# Patient Record
Sex: Female | Born: 1965 | Race: White | Hispanic: No | Marital: Married | Smoking: Never smoker
Health system: Southern US, Community
[De-identification: ages and names within clinical notes are randomized; demographics above are authoritative.]

## PROBLEM LIST (undated history)

## (undated) DIAGNOSIS — R569 Unspecified convulsions: Secondary | ICD-10-CM

## (undated) DIAGNOSIS — T7840XA Allergy, unspecified, initial encounter: Secondary | ICD-10-CM

## (undated) DIAGNOSIS — N189 Chronic kidney disease, unspecified: Secondary | ICD-10-CM

## (undated) DIAGNOSIS — Z5189 Encounter for other specified aftercare: Secondary | ICD-10-CM

## (undated) DIAGNOSIS — K219 Gastro-esophageal reflux disease without esophagitis: Secondary | ICD-10-CM

## (undated) DIAGNOSIS — D649 Anemia, unspecified: Secondary | ICD-10-CM

## (undated) DIAGNOSIS — M1990 Unspecified osteoarthritis, unspecified site: Secondary | ICD-10-CM

## (undated) HISTORY — DX: Unspecified convulsions: R56.9

## (undated) HISTORY — DX: Encounter for other specified aftercare: Z51.89

## (undated) HISTORY — DX: Chronic kidney disease, unspecified: N18.9

## (undated) HISTORY — PX: ABLATION: SHX5711

## (undated) HISTORY — PX: ABDOMINAL HYSTERECTOMY: SHX81

## (undated) HISTORY — DX: Anemia, unspecified: D64.9

## (undated) HISTORY — DX: Allergy, unspecified, initial encounter: T78.40XA

## (undated) HISTORY — DX: Unspecified osteoarthritis, unspecified site: M19.90

## (undated) HISTORY — DX: Gastro-esophageal reflux disease without esophagitis: K21.9

---

## 1999-06-01 ENCOUNTER — Other Ambulatory Visit: Admission: RE | Admit: 1999-06-01 | Discharge: 1999-06-01 | Payer: Self-pay | Admitting: Obstetrics and Gynecology

## 2001-12-04 ENCOUNTER — Other Ambulatory Visit: Admission: RE | Admit: 2001-12-04 | Discharge: 2001-12-04 | Payer: Self-pay | Admitting: Obstetrics and Gynecology

## 2002-11-27 ENCOUNTER — Other Ambulatory Visit: Admission: RE | Admit: 2002-11-27 | Discharge: 2002-11-27 | Payer: Self-pay | Admitting: Obstetrics and Gynecology

## 2004-03-08 ENCOUNTER — Other Ambulatory Visit: Admission: RE | Admit: 2004-03-08 | Discharge: 2004-03-08 | Payer: Self-pay | Admitting: Obstetrics and Gynecology

## 2004-06-09 ENCOUNTER — Ambulatory Visit (HOSPITAL_COMMUNITY): Admission: RE | Admit: 2004-06-09 | Discharge: 2004-06-09 | Payer: Self-pay | Admitting: Obstetrics and Gynecology

## 2004-06-09 ENCOUNTER — Encounter (INDEPENDENT_AMBULATORY_CARE_PROVIDER_SITE_OTHER): Payer: Self-pay | Admitting: Specialist

## 2005-03-21 ENCOUNTER — Other Ambulatory Visit: Admission: RE | Admit: 2005-03-21 | Discharge: 2005-03-21 | Payer: Self-pay | Admitting: Obstetrics and Gynecology

## 2007-07-19 ENCOUNTER — Ambulatory Visit (HOSPITAL_COMMUNITY): Admission: RE | Admit: 2007-07-19 | Discharge: 2007-07-19 | Payer: Self-pay | Admitting: Obstetrics and Gynecology

## 2007-07-19 ENCOUNTER — Encounter (INDEPENDENT_AMBULATORY_CARE_PROVIDER_SITE_OTHER): Payer: Self-pay | Admitting: Obstetrics and Gynecology

## 2011-02-24 ENCOUNTER — Emergency Department (HOSPITAL_COMMUNITY): Admission: EM | Admit: 2011-02-24 | Payer: Self-pay | Source: Home / Self Care

## 2011-05-03 NOTE — H&P (Signed)
Karen [name], Karen [name]                  ACCOUNT NO.:  [id number]   MEDICAL RECORD NO.:  [id number]          PATIENT TYPE:  AMB   LOCATION:  SDC                           FACILITY:  [facility]   PHYSICIAN:  Guy Sandifer. Henderson Cloud, M.D. DATE OF BIRTH:  [birth year]   DATE OF ADMISSION:  07/19/2007  DATE OF DISCHARGE:                              HISTORY & PHYSICAL   CHIEF COMPLAINT:  Heavy menses.   HISTORY OF PRESENT ILLNESS:  This patient is a 45 year old married white  female, G0, P0, whose husband is status post vasectomy, with  increasingly heavy menses.  She will have heavy flows, overflowing pads  and tampons, with clotting for several days at a time.  For careful  discussion of the options, she is being admitted for hysteroscopy D&C  with NovaSure endometrial ablation.   PAST MEDICAL HISTORY:  Seizure in office at age 43.   FAMILY HISTORY:  Diabetes, maternal grandmother; skin cancer mother and  maternal grandmother.   SOCIAL HISTORY:  Denies tobacco, alcohol or drug abuse.   MEDICATIONS:  Trazodone p.r.n. sleep, penicillin leading to seizure.   PAST SURGICAL HISTORY:  Laparoscopy, hysterectomy, D&C with an ovarian  cystectomy for a dermoid in 2005.   REVIEW OF SYSTEMS:  NEURO:  Denies headache.  CARDIOVASCULAR:  Denies  chest pain.  PULMONARY:  Denies shortness of breath.  GI:  Denies recent  changes and bowel habits.   PHYSICAL EXAMINATION:  VITAL SIGNS:  Height 5 feet, 1 inch, weight 146  pounds, blood pressure 110/72.  HEENT:  Without thyromegaly.  LUNGS:  Clear to auscultation.  HEART:  Regular rate and rhythm.  BACK:  Without CVA tenderness.  BREASTS:  Without mass or tracks or discharge.  ABDOMEN:  Soft, nontender without masses.  PELVIC:  Vulva, vagina and cervix without lesions.  Uterus is  anteverted, normal size, mobile, nontender.  Adnexa nontender without  masses.  EXTREMITIES:  Grossly within normal limits.  NEUROLOGICAL:  Grossly within normal limits.   ASSESSMENT:   Menorrhagia.   PLAN:  Hysteroscopy D&C with NovaSure endometrial ablation.      Guy Sandifer Henderson Cloud, M.D.  Electronically Signed     JET/MEDQ  D:  07/16/2007  T:  07/16/2007  Job:  [id number]

## 2011-05-03 NOTE — Op Note (Signed)
Karen [name], Karen [name]                  ACCOUNT NO.:  [id number]   MEDICAL RECORD NO.:  [id number]          PATIENT TYPE:  AMB   LOCATION:  SDC                           [facility]ACILITY:  [facility]   PHYSICIAN:  Guy Sandifer. Henderson Cloud, M.D. DATE OF BIRTH:  [birth year]   DATE OF PROCEDURE:  07/19/2007  DATE OF DISCHARGE:                               OPERATIVE REPORT   PREOPERATIVE DIAGNOSIS:  Menorrhagia.   POSTOPERATIVE DIAGNOSIS:  Menorrhagia.   PROCEDURE:  NovaSure endometrial ablation and hysteroscopy with  dilatation and curettage and 1% Xylocaine paracervical block   SURGEON:  Guy Sandifer. Henderson Cloud, M.D.   ANESTHESIA:  General with LMA.   SPECIMENS:  Endometrial curettings to pathology.   ESTIMATED BLOOD LOSS:  Minimal.   INPUT AND OUTPUT DISTENDING MEDIA:  50 mL deficit.   INDICATIONS AND CONSENT:  This patient is a 45 year old married white  female, G0, P0, whose husband is status post vasectomy, has had  increasingly heavy menses.  Details are dictated in the history and  physical.  Hysteroscopy, D&C, and NovaSure endometrial ablation has been  discussed preoperatively.  The potential risks and complications have  been reviewed preoperatively including but limited to infection, organ  damage, bleeding requiring transfusion of blood products with possible  transfusion reaction, HIV and hepatitis acquisition, DVT, PE, pneumonia,  hysterectomy, recurrent heavy or abnormal bleeding. and pelvic pain.  All questions were answered and consent signed on the chart.   FINDINGS:  The endometrial cavity is without abnormal structure.  The  fallopian tube ostia are identified bilaterally.   PROCEDURE IN DETAIL:  The patient is taken to the operating room where  she is placed in the dorsal supine position and general anesthesia is  induced via LMA.  She is then placed in the dorsal lithotomy position  where she is prepped, bladder straight catheterized, and she is draped  in a sterile fashion.  A bivalve  speculum was placed in the vagina and  the anterior cervical lip was injected with 1% plain Xylocaine and  grasped with a single tooth tenaculum.  Paracervical block of  approximately 20 mL total of 1% plain Xylocaine is placed at the 2, 4,  5, 7, 8, and 10 o'clock positions.  Uterine length is 8 cm and the  cervical length is 4 cm.  The cervix is gently progressively dilated to  a 31 dilator.  Diagnostic hysteroscope was placed in the endocervical  canal and advanced under direct visualization using the distending  media.  The above findings were noted.  The hysteroscope was then  withdrawn.  Sharp curettage is carried out.  The NovaSure endometrial  ablation device is then placed.  Cavity test is passed on the first  attempt.  Ablation is then carried out for approximately 1 minute 37  seconds.  The NovaSure device is then removed intact.  Inspection again with advancement of the hysteroscope under direct  visualization reveals a good ablation through the entire cavity and no  evidence of perforation.  The hysteroscope is removed.  All counts were  correct.  The patient is awakened  and taken to the recovery room in  stable condition.      Guy Sandifer Henderson Cloud, M.D.  Electronically Signed     JET/MEDQ  D:  07/19/2007  T:  07/19/2007  Job:  [id number]

## 2011-05-06 NOTE — Op Note (Signed)
NAME:  Karen [name], Karen [name]                           ACCOUNT NO.:  [id number]   MEDICAL RECORD NO.:  [id number]                   PATIENT TYPE:  AMB   LOCATION:  SDC                                  [facility]ILITY:  [facility]   PHYSICIAN:  Guy Sandifer. Arleta Creek, M.D.           DATE OF BIRTH:  1966-09-04   DATE OF PROCEDURE:  06/09/2004  DATE OF DISCHARGE:                                 OPERATIVE REPORT   PREOPERATIVE DIAGNOSES:  1. Menorrhagia.  2. Ovarian cysts.   POSTOPERATIVE DIAGNOSES:  1. Menorrhagia.  2. Ovarian cysts.   PROCEDURES:  1. Laparoscopy with left ovarian cystectomy.  2. Right paratubal cystectomy.  3. Hysteroscopy dilatation and curettage.  4. 1% Xylocaine paracervical block.   SURGEON:  Guy Sandifer. Henderson Cloud, M.D.   ANESTHESIA:  General with endotracheal intubation.   ESTIMATED BLOOD LOSS:  Less than 50 mL.   SPECIMENS:  1. Endometrial curettings.  2. Right paraovarian cyst.  3. Left ovarian cyst.   INS AND OUTS:  Sorbitol distending median 10 mL deficit.   INDICATIONS FOR PROCEDURE:  This patient is a 45 year old single white  female, G0, P0 with increasingly heavy menstrual flows as well as ovarian  cyst.  Details are dictated in the history and physical.  Hysteroscopy, D&C,  laparoscopy, possible ovarian cystectomies, possible unilateral salpingo-  oophorectomy has been discussed preoperatively.  Potential risks and  complications have been reviewed preoperatively including, not limited to,  infection, bowel, bladder, ureteral damage, bleeding requiring transfusion  of blood products and possible transfusion reaction, HIV and hepatitis  acquisition, DVT, PE, pneumonia, hysterectomy, recurrent cysts, and/or heavy  bleeding and laparotomy.  All questions have been answered and consent is  signed on the chart.   FINDINGS:  Upper abdomen is grossly normal.  There is a 3 cm translucent  right paratubal cyst immediately at the distal pole of the right ovary.  The  remainder of the right ovary looks normal.  The left ovary has a 1 to 2 cm  cysts at its distal pole.  Anterior and posterior cul-de-sacs are normal and  the uterus is normal.  The endometrial cavity is normal.  Both fallopian  tube ostia are identified.   DESCRIPTION OF PROCEDURE:  The patient is taken to the operating room where  she is identified, placed in the dorsal supine position and general  anesthesia is induced via endotracheal intubation.  She is then placed in  the dorsal lithotomy position, prepped abdominally and vaginally.  Bladder  is straight catheterized and she is draped in sterile fashion.  Bivalve  speculum is placed in the vagina and anterior cervical lip is injected with  1% Xylocaine and grasped with single-tooth tenaculum.  Paracervical block is  placed at the 2, 4, 5, 7, 8 and 10 o'clock positions with approximately 20  mL total of 1% Xylocaine plain.  Cervix is gently progressively dilated with  a [address]  dilator and the diagnostic hysteroscope is advanced through the  endocervical canal under direct visualization using Sorbitol distending  median.  The above findings were noted.  The hysteroscope was withdrawn.  Sharp curettage is carried out.  Single-tooth tenaculum is replaced with the  Hulka tenaculum.  Attention is turned to the abdomen.  A small  infraumbilical incision is made.  Towel clips are placed on the other side  of the umbilicus to aid in the elevation of the anterior abdominal wall.  Veress needle  is placed.  Syringe and drop test are normal and two liters  of gas are insufflated under low pressure.  There is good tympany in the  right upper quadrant.  Veress needle  is removed and is replaced with a  10/11 disposable trocar sleeve.  The 5 mm suprapubic incision is made and a  5 mm disposable bladeless trocar sleeve is placed under direct visualization  without difficulty.  The above findings are noted.  The peritoneum overlying  the cyst is  then incised with scissors and hydrodissected.  It is then taken  down sharply and the cyst is enucleated intact.  Hemostasis is obtained with  bipolar cautery.  Then using the 5 mm laparoscope to the suprapubic trocar  sleeve.  The EndoCatch is used to retrieve the cyst from the pelvis through  the umbilical incision.  Attention is then returned to the operative  laparoscope.  The left ovary is incised with scissors over the cyst.  This  is enucleated sharply and retrieved through the umbilical trocar sleeve.  Hemostasis is obtained with bipolar cautery.  Excess fluid is removed.  Intercede is back loaded through laparoscope and wrapped around the right  fallopian tube and the left ovary and slightly moistened.  Excess fluid is  removed.  Pneumoperitoneum is reduced and both trocar sleeves are removed.  A 0 Vicryl suture is used to close the deeper subcutaneous layers of the  umbilical incision with care being taken not to pick up any underlying  structures.  The skin is closed in umbilical incision with 2-0 Vicryl suture  in a mattress type fashion.  Both incisions are injected with 0.5% plain  Marcaine.  Dermabond is applied to both skin incisions.  Hulka is removed.  Good hemostasis is noted.  All counts are correct.  The patient is awakened  and taken to the recovery room in stable condition.                                               Guy Sandifer Arleta Creek, M.D.    JET/MEDQ  D:  06/09/2004  T:  06/09/2004  Job:  [id number]

## 2011-05-06 NOTE — H&P (Signed)
NAME:  Karen [name], Karen [name]                           ACCOUNT NO.:  [id number]   MEDICAL RECORD NO.:  [id number]                   PATIENT TYPE:  AMB   LOCATION:  SDC                                  [facility]ILITY:  [facility]   PHYSICIAN:  Guy Sandifer. Arleta Creek, M.D.           DATE OF BIRTH:  1966-04-11   DATE OF ADMISSION:  06/09/2004  DATE OF DISCHARGE:                                HISTORY & PHYSICAL   CHIEF COMPLAINT:  Heavy bleeding and ovarian cysts.   HISTORY OF PRESENT ILLNESS:  This patient is a 45 year old single white  female, G0, P0, who has increasingly heavy menstrual flows.  Ultrasound in  my office on April 05, 2004 revealed the uterus measuring 8.3 x 3.4 x 4.7  cm.  Sonohysterogram revealed some irregular thickening of the anterior wall  of the endometrial canal.  Bilateral ovarian cysts were also noted.  Repeat  ultrasound on June 01, 2004 revealed the left ovary to contain a 1.56-cm  irregularly shaped cyst.  The right ovary contained multiple cysts, one  measuring 1.6 cm with 2 solid components suspicious for dermoid.  After  discussing options of management, the patient is being admitted for  hysteroscopy with D&C, laparoscopy with possible LSO, possible RSO, possible  ovarian cystectomies.   PAST MEDICAL HISTORY:  1. History of reflux.  2. History of a single seizure in a doctor's office at age 74 with a     negative workup.   PAST SURGICAL HISTORY:  Past surgical history is negative.   MEDICATIONS:  None.   ALLERGIES:  PENICILLIN with a question of seizure activity following that.   SOCIAL HISTORY:  The patient denies tobacco, alcohol or drug abuse.   FAMILY HISTORY:  Diabetes in maternal grandmother, skin cancer in maternal  grandmother, basal cell carcinoma in mother.   REVIEW OF SYSTEMS:  NEUROLOGIC:  Denies headache.  CARDIAC:  Denies chest  pain.  PULMONARY:  Denies shortness of breath.  GI:  Denies recent changes  in bowel habits.   PHYSICAL EXAM:  GENERAL:   Height 5 feet 1 inch.  Weight 195 pounds.  VITAL SIGNS:  Blood pressure 136/76.  HEENT/NECK:  Without thyromegaly.  LUNGS:  Lungs clear to auscultation.  HEART:  Regular rate and rhythm.  BACK:  Back without CVA tenderness.  BREASTS:  Breasts without mass, retraction or discharge.  ABDOMEN:  Abdomen soft and nontender without masses.  PELVIC EXAM:  Vulva, vagina and cervix without lesion.  Uterus normal size,  mobile and nontender.  Adnexa nontender without palpable masses.  EXTREMITIES/NEUROLOGICAL:  Extremities and neurological exam grossly within  normal limits.   ASSESSMENT:  1. Menorrhagia.  2. Bilateral ovarian cysts.   PLAN:  Laparoscopy with possible right salpingo-oophorectomy, possible left  salpingo-oophorectomy, possible ovarian cystectomies and hysteroscopy with  dilatation and curettage.  Guy Sandifer Arleta Creek, M.D.    JET/MEDQ  D:  06/08/2004  T:  06/09/2004  Job:  [id number]

## 2011-10-03 LAB — CBC
Hemoglobin: 13.3
MCHC: 34.6
RBC: 4.22

## 2011-10-28 ENCOUNTER — Other Ambulatory Visit: Payer: Self-pay | Admitting: *Deleted

## 2011-10-28 NOTE — Telephone Encounter (Signed)
RN returned patient call.  Pt called several times very upset that her OxyContin authorization had not been completed.  Advised pt that we haven't rec'd an authorization from the pharmacy.  RN gave pt fax number of [phone] to have the pharmacy fax the request.  Pt verbalized understanding and stated that she was going to the pharmacy at 1pm and she would have them fax it at this time.  Pt stated that the request was due to increasing the medication from bid to tid.

## 2014-05-09 LAB — HM COLONOSCOPY

## 2014-10-14 ENCOUNTER — Other Ambulatory Visit: Payer: Self-pay | Admitting: Obstetrics and Gynecology

## 2014-10-16 LAB — CYTOLOGY - PAP

## 2015-08-17 ENCOUNTER — Other Ambulatory Visit: Payer: Self-pay | Admitting: Obstetrics and Gynecology

## 2017-11-02 ENCOUNTER — Other Ambulatory Visit: Payer: Self-pay | Admitting: Obstetrics and Gynecology

## 2017-11-02 DIAGNOSIS — R52 Pain, unspecified: Secondary | ICD-10-CM

## 2017-11-06 ENCOUNTER — Ambulatory Visit
Admission: RE | Admit: 2017-11-06 | Discharge: 2017-11-06 | Disposition: A | Discharge status: Home / Self Care | Payer: BLUE CROSS/BLUE SHIELD | Source: Ambulatory Visit | Attending: Obstetrics and Gynecology | Admitting: Obstetrics and Gynecology

## 2017-11-06 DIAGNOSIS — R52 Pain, unspecified: Secondary | ICD-10-CM

## 2019-03-19 IMAGING — MG 2D DIGITAL DIAGNOSTIC BILATERAL MAMMOGRAM WITH CAD AND ADJUNCT T
8 of 15 series · 8 of 35 positions shown · non-contrast
Comparison: Previous exam(s).

CLINICAL DATA: Patient presents for focal tenderness within the
upper-outer left breast for approximately 2 months.

EXAM:
2D DIGITAL DIAGNOSTIC BILATERAL MAMMOGRAM WITH CAD AND ADJUNCT TOMO
ULTRASOUND LEFT BREAST

[L TAN]
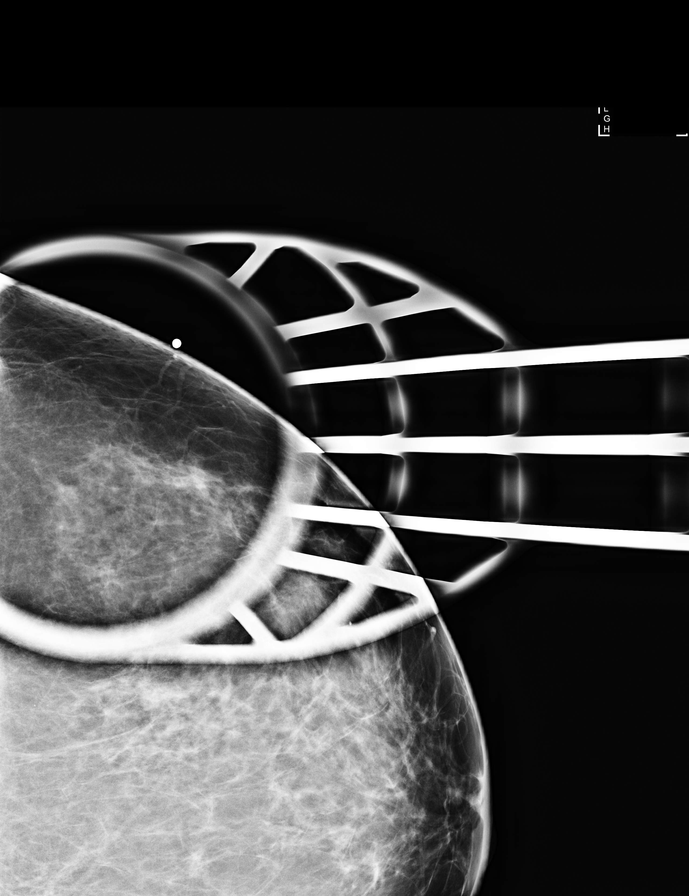

[R MLO synth-2D]
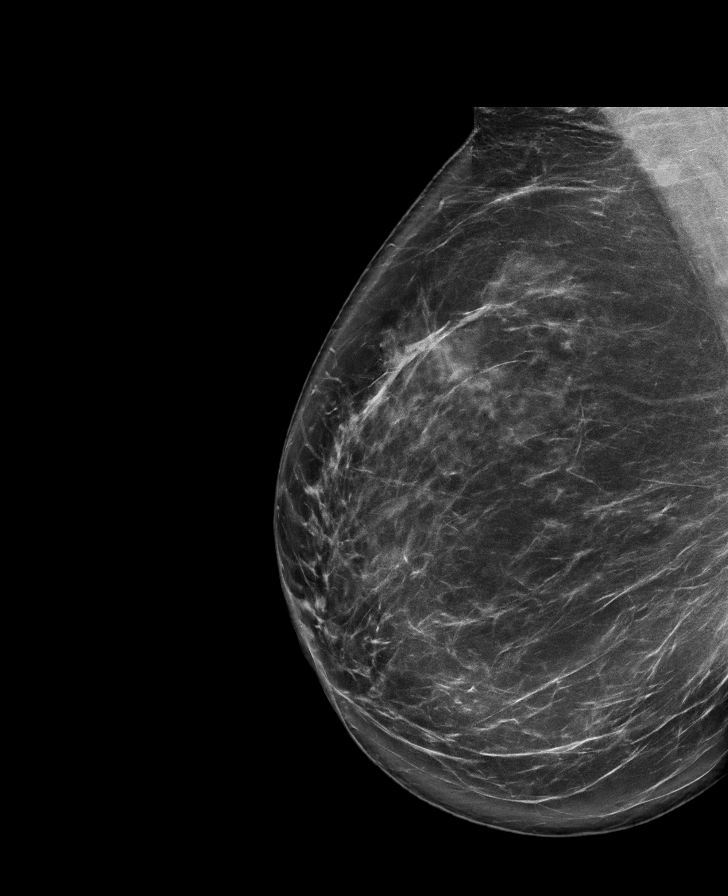

[L MLO]
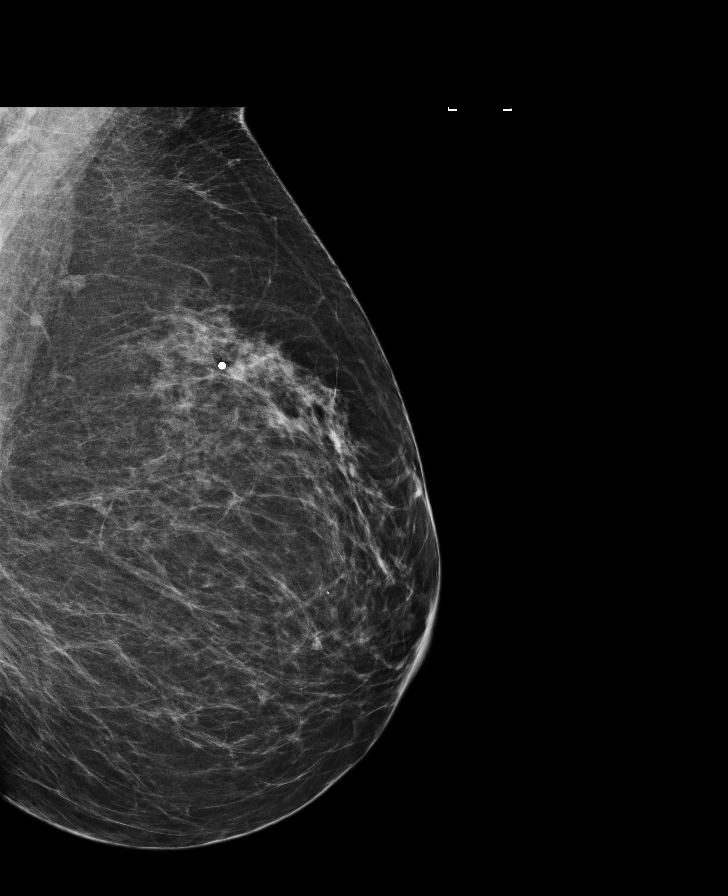

[R CC]
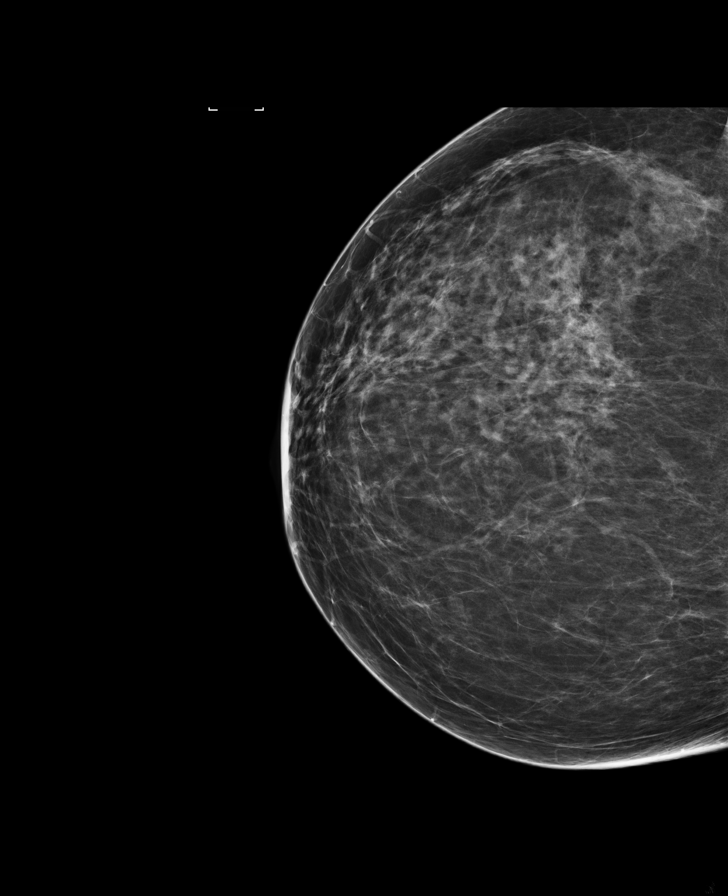

[R MLO]
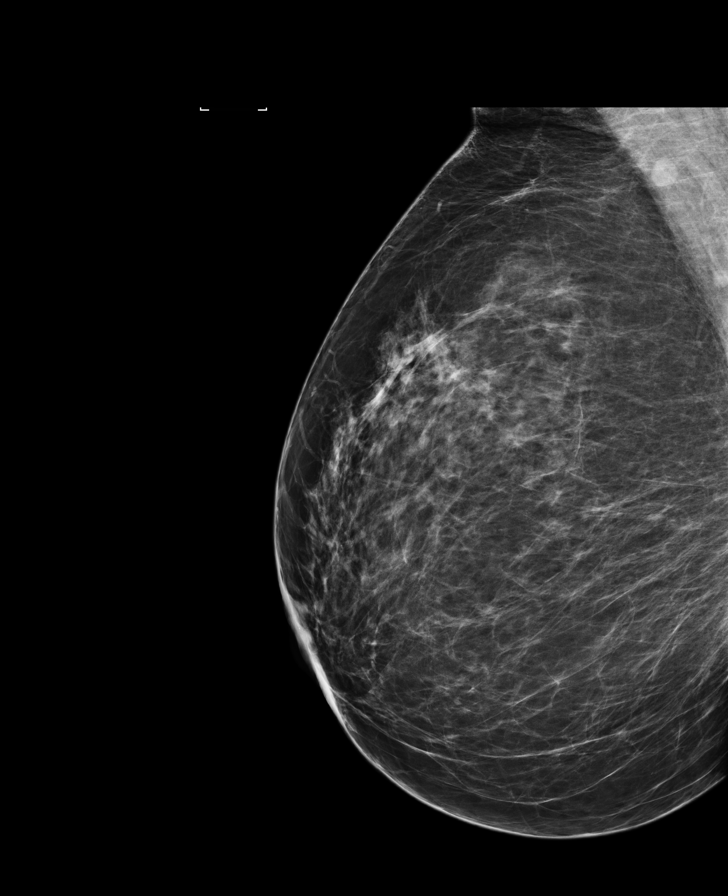

[L TAN synth-2D]
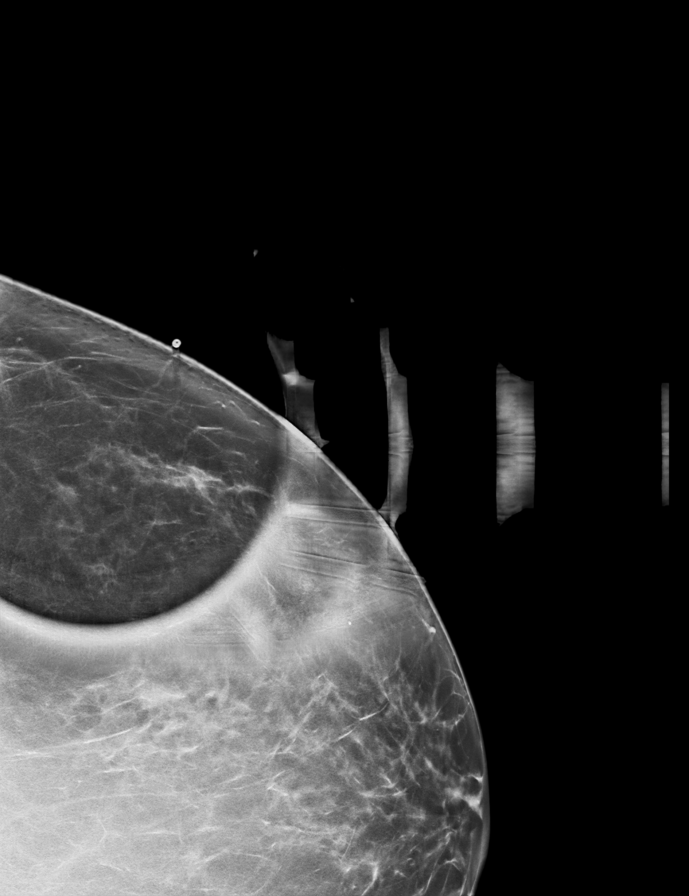

[L CC synth-2D]
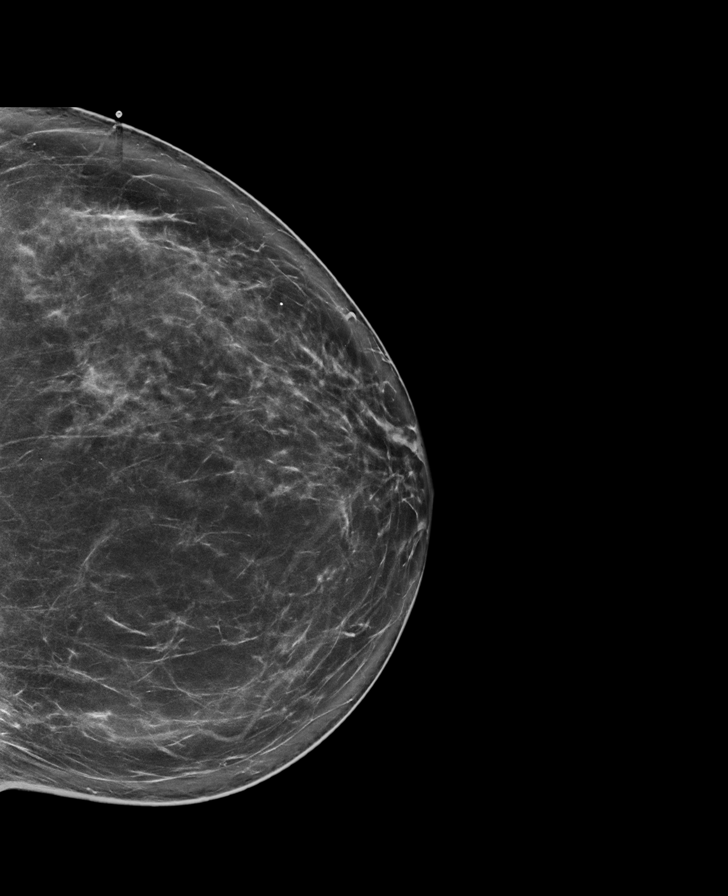

[L CC]
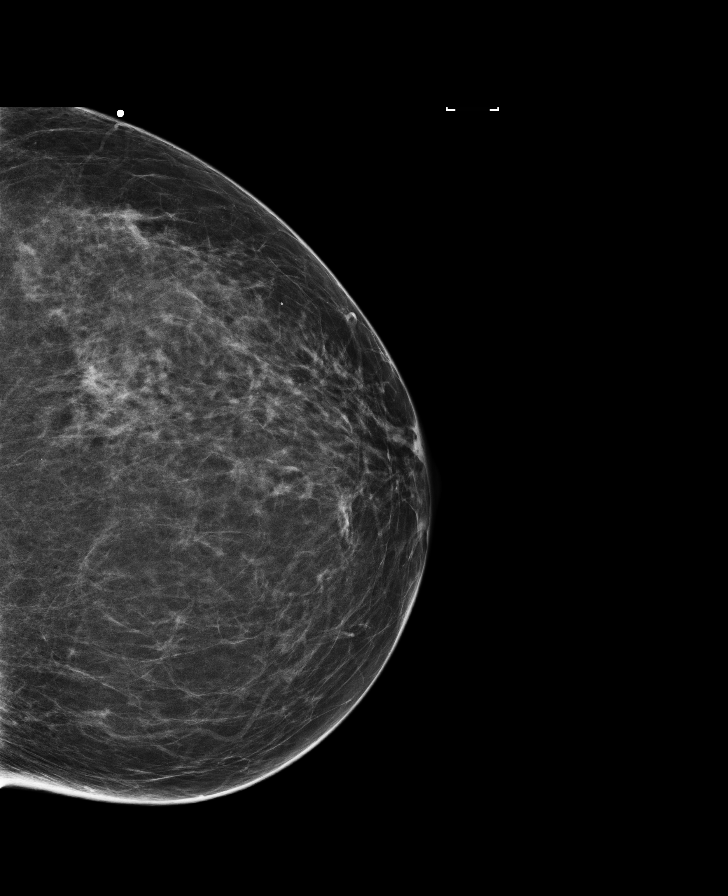

[8 of 35 positions shown; findings below may reference images not displayed]

ACR Breast Density Category c: The breast tissue is heterogeneously
dense, which may obscure small masses.
FINDINGS: No concerning masses, calcifications or distortion identified within
either breast.

Mammographic images were processed with CAD.

On physical exam, I palpate no discrete mass upper outer left
breast.

Targeted ultrasound is performed, showing normal tissue left breast
2 o'clock position 10 cm from the nipple at the patient reported
site of focal tenderness.
IMPRESSION: No evidence for malignancy.

Continued clinical evaluation for reported focal tenderness left
breast.

RECOMMENDATION:
Screening mammogram in one year.(Code:GB-[id number])

I have discussed the findings and recommendations with the patient.
Results were also provided in writing at the conclusion of the
visit. If applicable, a reminder letter will be sent to the patient
regarding the next appointment.

BI-RADS CATEGORY  1: Negative.

## 2023-01-09 DIAGNOSIS — D649 Anemia, unspecified: Secondary | ICD-10-CM | POA: Diagnosis not present

## 2023-01-09 DIAGNOSIS — Z1231 Encounter for screening mammogram for malignant neoplasm of breast: Secondary | ICD-10-CM | POA: Diagnosis not present

## 2023-01-09 DIAGNOSIS — Z130 Encounter for screening for diseases of the blood and blood-forming organs and certain disorders involving the immune mechanism: Secondary | ICD-10-CM | POA: Diagnosis not present

## 2023-01-09 DIAGNOSIS — Z131 Encounter for screening for diabetes mellitus: Secondary | ICD-10-CM | POA: Diagnosis not present

## 2023-01-09 DIAGNOSIS — Z1321 Encounter for screening for nutritional disorder: Secondary | ICD-10-CM | POA: Diagnosis not present

## 2023-01-09 DIAGNOSIS — Z01419 Encounter for gynecological examination (general) (routine) without abnormal findings: Secondary | ICD-10-CM | POA: Diagnosis not present

## 2023-01-09 DIAGNOSIS — Z6838 Body mass index (BMI) 38.0-38.9, adult: Secondary | ICD-10-CM | POA: Diagnosis not present

## 2023-01-09 DIAGNOSIS — Z13228 Encounter for screening for other metabolic disorders: Secondary | ICD-10-CM | POA: Diagnosis not present

## 2023-01-09 DIAGNOSIS — Z1329 Encounter for screening for other suspected endocrine disorder: Secondary | ICD-10-CM | POA: Diagnosis not present

## 2023-01-09 DIAGNOSIS — Z13220 Encounter for screening for lipoid disorders: Secondary | ICD-10-CM | POA: Diagnosis not present

## 2023-01-09 DIAGNOSIS — Z1272 Encounter for screening for malignant neoplasm of vagina: Secondary | ICD-10-CM | POA: Diagnosis not present

## 2023-01-10 ENCOUNTER — Other Ambulatory Visit: Payer: Self-pay

## 2023-01-10 DIAGNOSIS — D649 Anemia, unspecified: Secondary | ICD-10-CM | POA: Insufficient documentation

## 2023-01-11 ENCOUNTER — Encounter: Payer: Self-pay | Admitting: Obstetrics and Gynecology

## 2023-01-11 ENCOUNTER — Telehealth: Payer: Self-pay | Admitting: Pharmacy Technician

## 2023-01-11 NOTE — Telephone Encounter (Signed)
Dr. Gaetano Net, [facility] note;  Patient will be scheduled as soon as possible.  Auth Submission: NO AUTH NEEDED Payer: [employer] Medication & CPT/J Code(s) submitted: Venofer (Iron Sucrose) J1756 Route of submission (phone, fax, portal):  Phone # Fax # Auth type: Buy/Bill Units/visits requested: x3 Reference number:  Approval from: 01/11/23 to 05/12/23

## 2023-01-18 ENCOUNTER — Ambulatory Visit (INDEPENDENT_AMBULATORY_CARE_PROVIDER_SITE_OTHER): Payer: 59

## 2023-01-18 VITALS — BP 113/71 | HR 62 | Temp 98.3°F | Resp 16 | Ht 61.0 in | Wt 195.0 lb

## 2023-01-18 DIAGNOSIS — D649 Anemia, unspecified: Secondary | ICD-10-CM

## 2023-01-18 MED ORDER — ACETAMINOPHEN 325 MG PO TABS
650.0000 mg | ORAL_TABLET | Freq: Once | ORAL | Status: AC
  Administered 2023-01-18: 650 mg via ORAL
  Filled 2023-01-18: qty 2

## 2023-01-18 MED ORDER — DIPHENHYDRAMINE HCL 25 MG PO CAPS
25.0000 mg | ORAL_CAPSULE | Freq: Once | ORAL | Status: AC
  Administered 2023-01-18: 25 mg via ORAL
  Filled 2023-01-18: qty 1

## 2023-01-18 MED ORDER — SODIUM CHLORIDE 0.9 % IV SOLN
300.0000 mg | Freq: Once | INTRAVENOUS | Status: AC
  Administered 2023-01-18: 300 mg via INTRAVENOUS
  Filled 2023-01-18: qty 15

## 2023-01-18 NOTE — Progress Notes (Signed)
Diagnosis: Iron Deficiency Anemia  Provider:  Marshell Garfinkel MD  Procedure: Infusion  IV Type: Peripheral, IV Location: L Hand  Venofer (Iron Sucrose), Dose: 300 mg  Infusion Start Time: 5093  Infusion Stop Time: 2671  Post Infusion IV Care: Observation period completed and Peripheral IV Discontinued  Discharge: Condition: Good, Destination: Home . AVS provided to patient.   Performed by:  Koren Shiver, RN

## 2023-01-25 ENCOUNTER — Ambulatory Visit (INDEPENDENT_AMBULATORY_CARE_PROVIDER_SITE_OTHER): Payer: 59 | Admitting: *Deleted

## 2023-01-25 VITALS — BP 115/74 | HR 57 | Temp 98.3°F | Resp 18 | Ht 61.0 in | Wt 193.2 lb

## 2023-01-25 DIAGNOSIS — D649 Anemia, unspecified: Secondary | ICD-10-CM

## 2023-01-25 MED ORDER — ACETAMINOPHEN 325 MG PO TABS: 650.0000 mg | ORAL_TABLET | Freq: Once | ORAL | Status: DC

## 2023-01-25 MED ORDER — DIPHENHYDRAMINE HCL 25 MG PO CAPS: 25.0000 mg | ORAL_CAPSULE | Freq: Once | ORAL | Status: DC

## 2023-01-25 MED ORDER — SODIUM CHLORIDE 0.9 % IV SOLN
300.0000 mg | Freq: Once | INTRAVENOUS | Status: AC
  Administered 2023-01-25: 300 mg via INTRAVENOUS
  Filled 2023-01-25: qty 15

## 2023-01-25 NOTE — Progress Notes (Signed)
Diagnosis: Iron Deficiency Anemia  Provider:  Marshell Garfinkel MD  Procedure: Infusion  IV Type: Peripheral, IV Location: L Hand  Venofer (Iron Sucrose), Dose: 300 mg  Infusion Start Time: 2035 pm  Infusion Stop Time: 1535 pm  Post Infusion IV Care: Observation period completed and Peripheral IV Discontinued  Discharge: Condition: Good, Destination: Home . AVS Provided and AVS Declined  Performed by:  Oren Beckmann, RN

## 2023-01-31 ENCOUNTER — Ambulatory Visit (INDEPENDENT_AMBULATORY_CARE_PROVIDER_SITE_OTHER): Payer: 59

## 2023-01-31 VITALS — BP 120/74 | HR 51 | Temp 97.9°F | Resp 16 | Ht 61.0 in | Wt 193.0 lb

## 2023-01-31 DIAGNOSIS — D649 Anemia, unspecified: Secondary | ICD-10-CM | POA: Diagnosis not present

## 2023-01-31 MED ORDER — DIPHENHYDRAMINE HCL 25 MG PO CAPS: 25.0000 mg | ORAL_CAPSULE | Freq: Once | ORAL | Status: DC

## 2023-01-31 MED ORDER — SODIUM CHLORIDE 0.9 % IV SOLN
300.0000 mg | Freq: Once | INTRAVENOUS | Status: AC
  Administered 2023-01-31: 300 mg via INTRAVENOUS
  Filled 2023-01-31: qty 15

## 2023-01-31 MED ORDER — ACETAMINOPHEN 325 MG PO TABS: 650.0000 mg | ORAL_TABLET | Freq: Once | ORAL | Status: DC

## 2023-01-31 NOTE — Progress Notes (Signed)
Diagnosis: Iron Deficiency Anemia  Provider:  Marshell Garfinkel MD  Procedure: Infusion  IV Type: Peripheral, IV Location: R Hand  Venofer (Iron Sucrose), Dose: 300 mg  Infusion Start Time: [id number]  Infusion Stop Time: 1440  Post Infusion IV Care: Peripheral IV Discontinued  Discharge: Condition: Good, Destination: Home . AVS Declined  Performed by:  Adelina Mings, LPN

## 2023-02-17 DIAGNOSIS — D649 Anemia, unspecified: Secondary | ICD-10-CM | POA: Diagnosis not present

## 2023-08-23 ENCOUNTER — Ambulatory Visit (INDEPENDENT_AMBULATORY_CARE_PROVIDER_SITE_OTHER): Payer: 59 | Admitting: Family

## 2023-08-23 ENCOUNTER — Encounter: Payer: Self-pay | Admitting: Family

## 2023-08-23 VITALS — BP 127/83 | HR 8 | Temp 98.0°F | Ht 61.0 in | Wt 196.5 lb

## 2023-08-23 DIAGNOSIS — Z6837 Body mass index (BMI) 37.0-37.9, adult: Secondary | ICD-10-CM | POA: Diagnosis not present

## 2023-08-23 DIAGNOSIS — N952 Postmenopausal atrophic vaginitis: Secondary | ICD-10-CM | POA: Diagnosis not present

## 2023-08-23 DIAGNOSIS — M25552 Pain in left hip: Secondary | ICD-10-CM | POA: Diagnosis not present

## 2023-08-23 DIAGNOSIS — E669 Obesity, unspecified: Secondary | ICD-10-CM

## 2023-08-23 DIAGNOSIS — N941 Unspecified dyspareunia: Secondary | ICD-10-CM | POA: Diagnosis not present

## 2023-08-23 DIAGNOSIS — M25551 Pain in right hip: Secondary | ICD-10-CM | POA: Diagnosis not present

## 2023-08-23 DIAGNOSIS — N951 Menopausal and female climacteric states: Secondary | ICD-10-CM | POA: Diagnosis not present

## 2023-08-23 MED ORDER — ESTRING 2 MG VA RING
1.0000 | VAGINAL_RING | VAGINAL | 4 refills | Status: DC
Start: 2023-08-23 — End: 2023-11-21

## 2023-08-23 NOTE — Patient Instructions (Addendum)
Welcome to [facility], It was a pleasure meeting you today!    As discussed, I have sent the Estring (for vaginal dryness/pain)  to your pharmacy. You can look online for a coupon possibly to use at the pharmacy.  Let me know if your copay is still too expensive.  I have sent referrals to our Sports med and Medical weight loss offices.  You can try Black Cohosh, 400-500mg  3 times per day for hot flashes & night sweats. Estroven OTC has a combination of black cohosh, and other herbs and vitamins to help with a combination of symptoms. Magnesium glycinate, L-threonate, or taurate can help with anxiety, maintaining sleep (if taken at night), muscle recovery, good bowel function, hot flashes, and maintaining blood pressure.   For magnesium look for chelated form (better absorbability) and for any over the counter supplement or vitamin, look for organic or has a 3rd party seal from NSF international, [employer] or USP.  This authenticates the quality but not the efficacy (since not FDA approved).   You can schedule a physical with fasting labs at your convenience today.    PLEASE NOTE: If you had any LAB tests please let us know if you have not heard back within a few days. You may see your results on MyChart before we have a chance to review them but we will give you a call once they are reviewed by [place]. If we ordered any REFERRALS today, please let us know if you have not heard from their office within the next week.  Let us know through MyChart if you are needing REFILLS, or have your pharmacy send [place] the request. You can also use MyChart to communicate with me or any office staff.  Please try these tips to maintain a healthy lifestyle: It is important that you exercise regularly at least 30 minutes 5 times a week. Think about what you will eat, plan ahead. Choose whole foods, & think  "clean, green, fresh or frozen" over canned, processed or packaged foods  which are more sugary, salty, and fatty. 70 to 75% of food eaten should be fresh vegetables and protein. 2-3  meals daily with healthy snacks between meals, but must be whole fruit, protein or vegetables. Aim to eat over a 10 hour period when you are active, for example, 7am to 5pm, and then STOP after your last meal of the day, drinking only water.  Shorter eating windows, 6-8 hours, are showing benefits in heart disease and blood sugar regulation. Drink water every day! Shoot for 64 ounces daily = 8 cups, no other drink is as healthy! Fruit juice is best enjoyed in a healthy way, by EATING the fruit.

## 2023-08-23 NOTE — Assessment & Plan Note (Addendum)
chronic since 2019 hx of cortisone in right hip 2020 & 2021 after torn labrum  no hx of PT sending referral to Sports med for further assessment discussed benefits of PT and exercise f/u prn

## 2023-08-23 NOTE — Assessment & Plan Note (Addendum)
chronic hysterectomy in 2016 currently taking DIM an OTC supplement, advised to check if contains black cohosh and consider supplementing with black cohosh for hot flashes and night sweats - total of 1500mg  daily Sending Estring for vaginal sx, if not covered can try vaginal estrogen pills, advised on use & SE advised to look for Estring coupon online f/u 3 m

## 2023-08-23 NOTE — Assessment & Plan Note (Signed)
chronic, since childhood no previous meds, has done weight watchers with some success has friends going thru our [facility] clinic with success also advised to Check insurance coverage for weight loss medications. can start a medication if has to wait long time to get into [facility] clinic. Wt. Loss strategies reviewed including portion control, less carbs including sweets, eating most of calories earlier in day, drinking 64oz water qd, and establishing daily exercise routine.  f/u prn

## 2023-08-23 NOTE — Progress Notes (Signed)
New Patient Office Visit  Subjective:  Patient ID: Karen [name], female    DOB: [birth year]  Age: 57 y.o. MRN: [id number]  CC:  Chief Complaint  Patient presents with   Menopause    Pt c/o menopause sx, sweating, burning sensations. Has tried hibiscus tea which does help slightly.    Weight Loss   Hip Pain    Pt c/o bilateral hip pain for years, Pt would like a referral to ortho    HPI Karen [name] presents for establishing care today.  Bilateral hip pain:  started around 2019, right worse than left, had cortisone shots in 2020 & 2021 thru [facility], states pain feels worse in am when first arising out of bed. Did have torn labrum in right hip after an injury. Doesn't remember if told she had arthritis or not, denies any hx of other injuries or surgery.  Menopausal sx:  hx of hysterectomy in 2016 - sees Dr Huntley Dec at [facility] - unsure if ovaries taken or not; reports having more sweating even during day, takes OTC supplement DIM, which helped her hot flashes & night sweats but still has these sx. Hx of mother having endometrial cancer. Aunt with breast cancer. She has not been a candidate for hormone therapy due to her family history of cancer. She also experiences vaginal dryness and pain with intercourse.  Obesity:   Patient has had weight issues since 6th grade and has tried various methods for weight loss. 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 She has had success with weight watchers in past. She has friends who have had success with weight loss medications and is interested in exploring that option. Her friends stated they used our medical wt loss clinic and were told about   Assessment & Plan:  Bilateral hip pain Assessment & Plan: chronic since 2019 hx of cortisone in right hip 2020 & 2021 after torn labrum  no hx of PT sending referral to Sports med for further assessment discussed benefits of PT and exercise f/u prn  Orders: -      Ambulatory referral to Sports Medicine  Menopausal symptoms Assessment & Plan: chronic hysterectomy in 2016 currently taking DIM an OTC supplement, advised to check if contains black cohosh and consider supplementing with black cohosh for hot flashes and night sweats - total of 1500mg  daily Sending Estring for vaginal sx, if not covered can try vaginal estrogen pills, advised on use & SE advised to look for Estring coupon online f/u 3 m   Obesity (BMI 30-39.9) Assessment & Plan: chronic, since childhood no previous meds, has done weight watchers with some success has friends going thru our [facility] clinic with success also advised to Check insurance coverage for weight loss medications. can start a medication if has to wait long time to get into [facility] clinic. Wt. Loss strategies reviewed including portion control, less carbs including sweets, eating most of calories earlier in day, drinking 64oz water qd, and establishing daily exercise routine.  f/u prn  Orders: -     Amb Ref to Medical Weight Management  Vaginal atrophy -     Estring; Place 1 each vaginally every 3 (three) months. follow package directions  Dispense: 1 each; Refill: 4  Dyspareunia in female -     Estring; Place 1 each vaginally every 3 (three) months. follow package directions  Dispense: 1 each; Refill: 4   Subjective:    Outpatient Medications Prior to Visit  Medication Sig Dispense Refill   B Complex Vitamins (B COMPLEX PO) Take by mouth.     Berberine Chloride (BERBERINE HCI PO) Take by mouth.     Cholecalciferol 25 MCG (1000 UT) capsule 1 capsule every day by oral route.     Docusate Sodium (DSS) 100 MG CAPS      Evening Primrose Oil 1000 MG CAPS Take by mouth.     Ferrous Sulfate (IRON PO) Take by mouth.     ibuprofen (ADVIL) 200 MG tablet Take 200 mg by mouth every 6 (six) hours as needed.     Melatonin 10 MG CHEW Chew by mouth.     Multiple Vitamins-Minerals (MULTIVITAMIN WITH MINERALS) tablet Take 1  tablet by mouth daily.     Multiple Vitamins-Minerals (PRESERVISION AREDS 2 PO) Take by mouth.     Nutritional Supplements (ESTRO SUPPORT ES PO) Take by mouth.     pantoprazole (PROTONIX) 40 MG tablet Take 40 mg by mouth daily.     No facility-administered medications prior to visit.   Past Medical History:  Diagnosis Date   Allergy 1985   Penicillin   Anemia 2024   Possible cause taking multivitamin with no iron   Arthritis 2008   Shoulder, maybe other areas   Blood transfusion without reported diagnosis 2024   Low hemoglobin   Chronic kidney disease 1997   Kidney Stone   GERD (gastroesophageal reflux disease) 2005   Father had Esophageal Cancer   Seizures (HCC) 1985   Only one seizure, after taking penicillin   Past Surgical History:  Procedure Laterality Date   ABDOMINAL HYSTERECTOMY  2016   Precautionary due to Mother having Endometrial Cancer; I also had Ablation in 2007   ABLATION     2007    Objective:   Today's Vitals: BP 127/83   Pulse (!) 8   Temp 98 F (36.7 C) (Temporal)   Ht 5\' 1"  (1.549 m)   Wt 196 lb 8 oz (89.1 kg)   SpO2 96%   BMI 37.13 kg/m   Physical Exam Vitals and nursing note reviewed.  Constitutional:      Appearance: Normal appearance.  Cardiovascular:     Rate and Rhythm: Normal rate and regular rhythm.  Pulmonary:     Effort: Pulmonary effort is normal.     Breath sounds: Normal breath sounds.  Musculoskeletal:        General: Normal range of motion.  Skin:    General: Skin is warm and dry.  Neurological:     Mental Status: She is alert.  Psychiatric:        Mood and Affect: Mood normal.        Behavior: Behavior normal.     Meds ordered this encounter  Medications   estradiol (ESTRING) 7.5 MCG/24HR vaginal ring    Sig: Place 1 each vaginally every 3 (three) months. follow package directions    Dispense:  1 each    Refill:  4    Order Specific Question:   Supervising Provider    Answer:   ANDY, CAMILLE L [[id number]]     Dulce Sellar, NP

## 2023-08-24 ENCOUNTER — Encounter: Payer: Self-pay | Admitting: Obstetrics and Gynecology

## 2023-09-01 ENCOUNTER — Ambulatory Visit (INDEPENDENT_AMBULATORY_CARE_PROVIDER_SITE_OTHER): Payer: 59 | Admitting: Family Medicine

## 2023-09-01 VITALS — BP 138/85 | Ht 61.0 in | Wt 195.0 lb

## 2023-09-01 DIAGNOSIS — M25552 Pain in left hip: Secondary | ICD-10-CM

## 2023-09-01 NOTE — Progress Notes (Signed)
Karen [name] MRN [id number]  Date of birth: 11-18-1966  PCP: Karen Sellar, NP  Subjective:  No chief complaint on file. Left lateral hip pain  HPI: Past Medical, Surgical, Social, and Family History Reviewed & Updated per EMR.   Patient is a 57 y.o. [name] here for severity: Moderate, timing: intermittent, unchanged, localized, pain located over the left lateral hip that started after she crossed her left leg and then sat down. It is exacerbated by increased movement or sleeping on that side and alleviated by positioning.  She has also had some mild discomfort over the left lateral extremity.  She has not had any numbness or weakness distally  Past Medical History:  Diagnosis Date   Allergy 1985   Penicillin   Anemia 2024   Possible cause taking multivitamin with no iron   Arthritis 2008   Shoulder, maybe other areas   Blood transfusion without reported diagnosis 2024   Low hemoglobin   Chronic kidney disease 1997   Kidney Stone   GERD (gastroesophageal reflux disease) 2005   Father had Esophageal Cancer   Seizures (HCC) 1985   Only one seizure, after taking penicillin    Current Outpatient Medications on File Prior to Visit  Medication Sig Dispense Refill   B Complex Vitamins (B COMPLEX PO) Take by mouth.     Berberine Chloride (BERBERINE HCI PO) Take by mouth.     Cholecalciferol 25 MCG (1000 UT) capsule 1 capsule every day by oral route.     Docusate Sodium (DSS) 100 MG CAPS      estradiol (ESTRING) 7.5 MCG/24HR vaginal ring Place 1 each vaginally every 3 (three) months. follow package directions 1 each 4   Evening Primrose Oil 1000 MG CAPS Take by mouth.     Ferrous Sulfate (IRON PO) Take by mouth.     ibuprofen (ADVIL) 200 MG tablet Take 200 mg by mouth every 6 (six) hours as needed.     Melatonin 10 MG CHEW Chew by mouth.     Multiple Vitamins-Minerals (MULTIVITAMIN WITH MINERALS) tablet Take 1 tablet by mouth daily.     Multiple Vitamins-Minerals  (PRESERVISION AREDS 2 PO) Take by mouth.     Nutritional Supplements (ESTRO SUPPORT ES PO) Take by mouth.     pantoprazole (PROTONIX) 40 MG tablet Take 40 mg by mouth daily.     No current facility-administered medications on file prior to visit.    Past Surgical History:  Procedure Laterality Date   ABDOMINAL HYSTERECTOMY  2016   Precautionary due to Mother having Endometrial Cancer; I also had Ablation in 2007   ABLATION     2007    Allergies  Allergen Reactions   Penicillins Other (See Comments)    Seizure, at age 54 while taking penicillin        Objective:  Physical Exam: VS: BP:138/85  HR: bpm  TEMP: ( )  RESP:   HT:5\' 1"  (154.9 cm)   WT:195 lb (88.5 kg)  BMI:36.86  Gen: NAD, speaks clearly, comfortable in exam room Respiratory: normal work of breathing on room air Skin: No rashes, abrasions, or ecchymosis MSK: Normal gait Trendelenburg positive left greater than right There is endpoint tenderness to palpation over the posterior aspect of the left greater trochanter Full range of motion in flexion, extension, IR, ER Strength 5/5 in all directions except 3/5 in resisted hip abduction Logroll negative FABER equivocal, FADIR neg Ober's positive Mild discomfort to palpation over the left lateral femoral condyle  NVID    Assessment & Plan:   Greater trochanteric pain syndrome of left lower extremity - History and exam consistent with greater trochanteric pain given her hip abductor weakness and tightness of the IT band.  Her pain is not severe enough for injection therapy at this point. - After discussion with treatment options she would like to try home exercises before formal physical therapy. - We we will start hip abduction strengthening exercises, IT band stretching - We also discussed localized self massage to tolerance and ice. -follow-up in 4 weeks    Karen Mote MD [facility] Sports Medicine Fellow   Addendum:  Patient seen in the office by  fellow.  His history, exam, plan of care were precepted with me.  Karen Blizzard MD Karen [name]

## 2023-09-01 NOTE — Assessment & Plan Note (Addendum)
-   History and exam consistent with greater trochanteric pain given her hip abductor weakness and tightness of the IT band.  Her pain is not severe enough for injection therapy at this point. - After discussion with treatment options she would like to try home exercises before formal physical therapy. - We we will start hip abduction strengthening exercises, IT band stretching - We also discussed localized self massage to tolerance and ice. -follow-up in 4 weeks

## 2023-09-04 ENCOUNTER — Encounter: Payer: Self-pay | Admitting: Family Medicine

## 2023-10-04 ENCOUNTER — Ambulatory Visit (INDEPENDENT_AMBULATORY_CARE_PROVIDER_SITE_OTHER): Payer: 59 | Admitting: Family Medicine

## 2023-10-04 ENCOUNTER — Ambulatory Visit: Payer: 59 | Admitting: Family Medicine

## 2023-10-04 VITALS — BP 128/84 | Ht 61.0 in | Wt 196.0 lb

## 2023-10-04 DIAGNOSIS — M25552 Pain in left hip: Secondary | ICD-10-CM | POA: Diagnosis not present

## 2023-10-04 NOTE — Progress Notes (Signed)
Karen [name] - 57 y.o. female MRN [id number]  Date of birth: 1966/08/17  PCP: Karen Sellar, NP  Subjective:  No chief complaint on file.  Left greater trochanteric pain syndrome  HPI: Past Medical, Surgical, Social, and Family History Reviewed & Updated per EMR.   Patient is a 57 y.o. female here for follow up on L GTPS, last seen on 09/01/2023. The patient has tried home hip abductor strengthening exercises which has improved her pain and strength although she went on vacation for 2 weeks and was not consistent. She is having some intermittent mild pain in the L knee but realizes she has been bending her knee and performing more of a step up rather than a side step.   Past Medical History:  Diagnosis Date   Allergy 1985   Penicillin   Anemia 2024   Possible cause taking multivitamin with no iron   Arthritis 2008   Shoulder, maybe other areas   Blood transfusion without reported diagnosis 2024   Low hemoglobin   Chronic kidney disease 1997   Kidney Stone   GERD (gastroesophageal reflux disease) 2005   Father had Esophageal Cancer   Seizures (HCC) 1985   Only one seizure, after taking penicillin    Current Outpatient Medications on File Prior to Visit  Medication Sig Dispense Refill   B Complex Vitamins (B COMPLEX PO) Take by mouth.     Berberine Chloride (BERBERINE HCI PO) Take by mouth.     Cholecalciferol 25 MCG (1000 UT) capsule 1 capsule every day by oral route.     Docusate Sodium (DSS) 100 MG CAPS      estradiol (ESTRING) 7.5 MCG/24HR vaginal ring Place 1 each vaginally every 3 (three) months. follow package directions 1 each 4   Evening Primrose Oil 1000 MG CAPS Take by mouth.     Ferrous Sulfate (IRON PO) Take by mouth.     ibuprofen (ADVIL) 200 MG tablet Take 200 mg by mouth every 6 (six) hours as needed.     Melatonin 10 MG CHEW Chew by mouth.     Multiple Vitamins-Minerals (MULTIVITAMIN WITH MINERALS) tablet Take 1 tablet by mouth daily.     Multiple  Vitamins-Minerals (PRESERVISION AREDS 2 PO) Take by mouth.     Nutritional Supplements (ESTRO SUPPORT ES PO) Take by mouth.     pantoprazole (PROTONIX) 40 MG tablet Take 40 mg by mouth daily.     No current facility-administered medications on file prior to visit.    Past Surgical History:  Procedure Laterality Date   ABDOMINAL HYSTERECTOMY  2016   Precautionary due to Mother having Endometrial Cancer; I also had Ablation in 2007   ABLATION     2007    Allergies  Allergen Reactions   Penicillins Other (See Comments)    Seizure, at age 12 while taking penicillin        Objective:  Physical Exam: VS: BP:128/84  HR: bpm  TEMP: ( )  RESP:   HT:5\' 1"  (154.9 cm)   WT:196 lb (88.9 kg)  BMI:37.05  Gen: NAD, speaks clearly, comfortable in exam room Respiratory: Normal respiratory effort on room air. No signs of distress Skin: No rashes, abrasions, or ecchymosis MSK:  Gait normal Inspection of the L hip normal ROM full w/out pain Greater trochanter and anterior hip NT Strength 5/5 in flexion and extension, 4/5 in abduction No sensory deficits    Assessment & Plan:   Greater trochanteric pain syndrome of left lower extremity - Karen [name] has  responded very well to the home exercises when consistent with the therapy. Her pain is decreased and strength has increased on exam.  - Modifications to sidestep exercise discussed to focus on abductors instead of quadriceps.  - We discussed incorporating these exercises into her daily routine for maintenance.  - I will see her back as needed.      Karen Mote MD [facility] Sports Medicine Fellow  Addendum:  Patient seen in the office by fellow.  History, exam, plan of care were precepted with me.  Karen Lamer, DO, CAQSM

## 2023-10-04 NOTE — Assessment & Plan Note (Addendum)
-   Verdean has responded very well to the home exercises when consistent with the therapy. Her pain is decreased and strength has increased on exam.  - Modifications to sidestep exercise discussed to focus on abductors instead of quadriceps.  - We discussed incorporating these exercises into her daily routine for maintenance.  - I will see her back as needed.

## 2023-11-21 ENCOUNTER — Ambulatory Visit (INDEPENDENT_AMBULATORY_CARE_PROVIDER_SITE_OTHER): Payer: 59 | Admitting: Adult Health

## 2023-11-21 ENCOUNTER — Encounter (INDEPENDENT_AMBULATORY_CARE_PROVIDER_SITE_OTHER): Payer: Self-pay | Admitting: Adult Health

## 2023-11-21 VITALS — BP 127/79 | HR 58 | Temp 97.6°F | Ht 59.5 in | Wt 199.0 lb

## 2023-11-21 DIAGNOSIS — Z6839 Body mass index (BMI) 39.0-39.9, adult: Secondary | ICD-10-CM | POA: Diagnosis not present

## 2023-11-21 DIAGNOSIS — E559 Vitamin D deficiency, unspecified: Secondary | ICD-10-CM

## 2023-11-21 DIAGNOSIS — E669 Obesity, unspecified: Secondary | ICD-10-CM | POA: Diagnosis not present

## 2023-11-21 NOTE — Progress Notes (Signed)
Office: [phone]  /  Fax: [phone]   Initial Visit  Karen [name] was seen in clinic today to evaluate for obesity. She is interested in losing weight to improve overall health and reduce the risk of weight related complications. She presents today to review program treatment options, initial physical assessment, and evaluation.     She was referred by: PCP  When asked what else they would like to accomplish? She states: Adopt healthier eating patterns, Improve energy levels and physical activity, Improve quality of life, and Improve appearance  Weight history: She reports weight concerns started in 6th grade.  She endorses steady weight gain the last 20 years. She has been retired from [employer] for two years.  When asked how has your weight affected you? She states: Having fatigue, Having poor endurance, and Problems with eating patterns  Some associated conditions: GERD, Vit D Def  Contributing factors: Reduced physical activity, Eating patterns, and Menopause  Weight promoting medications identified: None  Current nutrition plan: None  Current level of physical activity: Walking and NEAT  Current or previous pharmacotherapy: None  Response to medication: Never tried medications   Past medical history includes:   Past Medical History:  Diagnosis Date   Allergy 1985   Penicillin   Anemia 2024   Possible cause taking multivitamin with no iron   Arthritis 2008   Shoulder, maybe other areas   Blood transfusion without reported diagnosis 2024   Low hemoglobin   Chronic kidney disease 1997   Kidney Stone   GERD (gastroesophageal reflux disease) 2005   Father had Esophageal Cancer   Seizures (HCC) 1985   Only one seizure, after taking penicillin     Objective:   BP 127/79   Pulse (!) 58   Temp 97.6 F (36.4 C)   Ht 4' 11.5" (1.511 m)   Wt 199 lb (90.3 kg)   SpO2 97%   BMI 39.52 kg/m  She was weighed on the bioimpedance scale: Body mass index is 39.52  kg/m.  Peak Weight:216 , Body Fat%:48.5, Visceral Fat Rating:15, Weight trend over the last 12 months: Unchanged  General:  Alert, oriented and cooperative. Patient is in no acute distress.  Respiratory: Normal respiratory effort, no problems with respiration noted   Gait: able to ambulate independently  Mental Status: Normal mood and affect. Normal behavior. Normal judgment and thought content.   DIAGNOSTIC DATA REVIEWED:  BMET No results found for: "NA", "K", "CL", "CO2", "GLUCOSE", "BUN", "CREATININE", "CALCIUM", "GFRNONAA", "GFRAA" No results found for: "HGBA1C" No results found for: "INSULIN" CBC    Component Value Date/Time   WBC 8.6 07/19/2007 1110   RBC 4.22 07/19/2007 1110   HGB 13.3 07/19/2007 1110   HCT 38.4 07/19/2007 1110   PLT 305 07/19/2007 1110   MCV 91.0 07/19/2007 1110   MCHC 34.6 07/19/2007 1110   RDW 12.1 07/19/2007 1110   Iron/TIBC/Ferritin/ %Sat No results found for: "IRON", "TIBC", "FERRITIN", "IRONPCTSAT" Lipid Panel  No results found for: "CHOL", "TRIG", "HDL", "CHOLHDL", "VLDL", "LDLCALC", "LDLDIRECT" Hepatic Function Panel  No results found for: "PROT", "ALBUMIN", "AST", "ALT", "ALKPHOS", "BILITOT", "BILIDIR", "IBILI" No results found for: "TSH"   Assessment and Plan:   Vitamin D deficiency  Obesity (BMI 30-39.9), Starting BMI 39.54  ESTABLISH WITH HWW   Obesity Treatment / Action Plan:  Patient will work on garnering support from family and friends to begin weight loss journey. Will work on eliminating or reducing the presence of highly palatable, calorie dense foods in the home.  Will complete provided nutritional and psychosocial assessment questionnaire before the next appointment. Will be scheduled for indirect calorimetry to determine resting energy expenditure in a fasting state.  This will allow [place] to create a reduced calorie, high-protein meal plan to promote loss of fat mass while preserving muscle mass. Counseled on the health  benefits of losing 5%-15% of total body weight. Was counseled on nutritional approaches to weight loss and benefits of reducing processed foods and consuming plant-based foods and high quality protein as part of nutritional weight management. Was counseled on pharmacotherapy and role as an adjunct in weight management.   Obesity Education Performed Today:  She was weighed on the bioimpedance scale and results were discussed and documented in the synopsis.  We discussed obesity as a disease and the importance of a more detailed evaluation of all the factors contributing to the disease.  We discussed the importance of long term lifestyle changes which include nutrition, exercise and behavioral modifications as well as the importance of customizing this to her specific health and social needs.  We discussed the benefits of reaching a healthier weight to alleviate the symptoms of existing conditions and reduce the risks of the biomechanical, metabolic and psychological effects of obesity.  Karen [name] appears to be in the action stage of change and states they are ready to start intensive lifestyle modifications and behavioral modifications.  30 minutes was spent today on this visit including the above counseling, pre-visit chart review, and post-visit documentation.  Reviewed by clinician on day of visit: allergies, medications, problem list, medical history, surgical history, family history, social history, and previous encounter notes pertinent to obesity diagnosis.   Karen [name] d. Karen Lasecki, NP-C

## 2024-02-05 DIAGNOSIS — Z01419 Encounter for gynecological examination (general) (routine) without abnormal findings: Secondary | ICD-10-CM | POA: Diagnosis not present

## 2024-02-05 DIAGNOSIS — Z13820 Encounter for screening for osteoporosis: Secondary | ICD-10-CM | POA: Diagnosis not present

## 2024-02-05 DIAGNOSIS — Z1231 Encounter for screening mammogram for malignant neoplasm of breast: Secondary | ICD-10-CM | POA: Diagnosis not present

## 2024-02-05 DIAGNOSIS — Z6841 Body Mass Index (BMI) 40.0 and over, adult: Secondary | ICD-10-CM | POA: Diagnosis not present

## 2024-02-28 ENCOUNTER — Telehealth: Payer: Self-pay

## 2024-02-28 DIAGNOSIS — Z114 Encounter for screening for human immunodeficiency virus [HIV]: Secondary | ICD-10-CM

## 2024-02-28 DIAGNOSIS — Z0000 Encounter for general adult medical examination without abnormal findings: Secondary | ICD-10-CM

## 2024-02-28 DIAGNOSIS — Z1159 Encounter for screening for other viral diseases: Secondary | ICD-10-CM

## 2024-02-28 NOTE — Telephone Encounter (Signed)
 Copied from CRM [id number]. Topic: Clinical - Request for Lab/Test Order >> Feb 28, 2024 10:35 AM Shelbie Proctor wrote: Reason for CRM: Patient [phone] has an cpx 03/06/24 at 8:30 am and wants to have labs done prior to discuss with NP, Hudnell. Please advise and call back.     Ok to place CPE labs ?

## 2024-02-28 NOTE — Telephone Encounter (Signed)
 ok to enter complete CPE labs inc HIV & Hep C and have pt come in at least 2 days prior - fasting - thx.

## 2024-02-29 NOTE — Addendum Note (Signed)
 Addended by: Candie Chroman on: 02/29/2024 09:13 AM   Modules accepted: Orders

## 2024-03-01 ENCOUNTER — Other Ambulatory Visit (INDEPENDENT_AMBULATORY_CARE_PROVIDER_SITE_OTHER)

## 2024-03-01 DIAGNOSIS — Z0000 Encounter for general adult medical examination without abnormal findings: Secondary | ICD-10-CM | POA: Diagnosis not present

## 2024-03-01 DIAGNOSIS — Z1159 Encounter for screening for other viral diseases: Secondary | ICD-10-CM | POA: Diagnosis not present

## 2024-03-01 DIAGNOSIS — Z114 Encounter for screening for human immunodeficiency virus [HIV]: Secondary | ICD-10-CM | POA: Diagnosis not present

## 2024-03-01 LAB — CBC WITH DIFFERENTIAL/PLATELET
Basophils Absolute: 0 10*3/uL (ref 0.0–0.1)
Basophils Relative: 0.5 % (ref 0.0–3.0)
Eosinophils Absolute: 0.2 10*3/uL (ref 0.0–0.7)
Eosinophils Relative: 2.7 % (ref 0.0–5.0)
HCT: 42.5 % (ref 36.0–46.0)
Hemoglobin: 14.4 g/dL (ref 12.0–15.0)
Lymphocytes Relative: 41.6 % (ref 12.0–46.0)
Lymphs Abs: 2.4 10*3/uL (ref 0.7–4.0)
MCHC: 33.9 g/dL (ref 30.0–36.0)
MCV: 92.4 fl (ref 78.0–100.0)
Monocytes Absolute: 0.4 10*3/uL (ref 0.1–1.0)
Monocytes Relative: 7.1 % (ref 3.0–12.0)
Neutro Abs: 2.7 10*3/uL (ref 1.4–7.7)
Neutrophils Relative %: 48.1 % (ref 43.0–77.0)
Platelets: 301 10*3/uL (ref 150.0–400.0)
RBC: 4.6 Mil/uL (ref 3.87–5.11)
RDW: 12.9 % (ref 11.5–15.5)
WBC: 5.7 10*3/uL (ref 4.0–10.5)

## 2024-03-01 LAB — LIPID PANEL
Cholesterol: 203 mg/dL — ABNORMAL HIGH (ref 0–200)
HDL: 55.6 mg/dL (ref >39.00)
LDL Cholesterol: 126 mg/dL — ABNORMAL HIGH (ref 0–99)
NonHDL: 147.49
Total CHOL/HDL Ratio: 4
Triglycerides: 106 mg/dL (ref 0.0–149.0)
VLDL: 21.2 mg/dL (ref 0.0–40.0)

## 2024-03-01 LAB — COMPREHENSIVE METABOLIC PANEL
ALT: 22 U/L (ref 0–35)
AST: 25 U/L (ref 0–37)
Albumin: 4.3 g/dL (ref 3.5–5.2)
Alkaline Phosphatase: 68 U/L (ref 39–117)
BUN: 19 mg/dL (ref 6–23)
CO2: 28 meq/L (ref 19–32)
Calcium: 9.3 mg/dL (ref 8.4–10.5)
Chloride: 106 meq/L (ref 96–112)
Creatinine, Ser: 0.62 mg/dL (ref 0.40–1.20)
GFR: 98.4 mL/min (ref >60.00)
Glucose, Bld: 93 mg/dL (ref 70–99)
Potassium: 3.9 meq/L (ref 3.5–5.1)
Sodium: 143 meq/L (ref 135–145)
Total Bilirubin: 0.4 mg/dL (ref 0.2–1.2)
Total Protein: 6.7 g/dL (ref 6.0–8.3)

## 2024-03-01 LAB — TSH: TSH: 1.53 u[IU]/mL (ref 0.35–5.50)

## 2024-03-02 LAB — HEPATITIS C ANTIBODY: Hepatitis C Ab: NONREACTIVE

## 2024-03-02 LAB — HIV ANTIBODY (ROUTINE TESTING W REFLEX): HIV 1&2 Ab, 4th Generation: NONREACTIVE

## 2024-03-05 ENCOUNTER — Telehealth: Payer: Self-pay

## 2024-03-05 NOTE — Telephone Encounter (Signed)
 Copied from CRM [id number]. Topic: Appointments - Appointment Cancel/Reschedule >> Mar 05, 2024  3:59 PM Pascal Lux wrote: Patient/patient representative is calling to cancel or reschedule an appointment. Refer to attachments for appointment information. Patient cancelled appointment because she's sick.

## 2024-03-06 ENCOUNTER — Encounter: Admitting: Family

## 2024-03-06 NOTE — Telephone Encounter (Signed)
 Reviewed

## 2024-03-08 NOTE — Progress Notes (Signed)
 did patient ever schedule appt for her physical? labs were done ahead of appt

## 2024-03-14 ENCOUNTER — Ambulatory Visit (INDEPENDENT_AMBULATORY_CARE_PROVIDER_SITE_OTHER): Admitting: Family

## 2024-03-14 ENCOUNTER — Encounter: Payer: Self-pay | Admitting: Family

## 2024-03-14 VITALS — BP 126/79 | HR 63 | Temp 98.0°F | Ht 59.5 in | Wt 203.0 lb

## 2024-03-14 DIAGNOSIS — R7611 Nonspecific reaction to tuberculin skin test without active tuberculosis: Secondary | ICD-10-CM

## 2024-03-14 DIAGNOSIS — Z0000 Encounter for general adult medical examination without abnormal findings: Secondary | ICD-10-CM | POA: Diagnosis not present

## 2024-03-14 DIAGNOSIS — Z1283 Encounter for screening for malignant neoplasm of skin: Secondary | ICD-10-CM

## 2024-03-14 DIAGNOSIS — Z23 Encounter for immunization: Secondary | ICD-10-CM

## 2024-03-14 DIAGNOSIS — Z1211 Encounter for screening for malignant neoplasm of colon: Secondary | ICD-10-CM

## 2024-03-14 DIAGNOSIS — K219 Gastro-esophageal reflux disease without esophagitis: Secondary | ICD-10-CM

## 2024-03-14 MED ORDER — PANTOPRAZOLE SODIUM 40 MG PO TBEC: 40.0000 mg | DELAYED_RELEASE_TABLET | Freq: Every day | ORAL | 1 refills | Status: AC

## 2024-03-14 NOTE — Patient Instructions (Addendum)
 It was very nice to see you today!   I have sent referrals for your Colonoscopy and Dermatology. I have sent in the Pantoprazole to [facility]. Remember to watch the saturated fat in your diet, try to exercise more getting 20-29minutes of cardio as many days as able.  Stay well and enjoy the Spring Season :-)      PLEASE NOTE:  If you had any lab tests please let us know if you have not heard back within a few days. You may see your results on MyChart before we have a chance to review them but we will give you a call once they are reviewed by [place]. If we ordered any referrals today, please let us know if you have not heard from their office within the next week.

## 2024-03-14 NOTE — Progress Notes (Signed)
 Phone [phone]  Subjective:   Patient is a 57 y.o. female presenting for annual physical.    Chief Complaint  Patient presents with   Annual Exam    Labs done on 3/14   Discussed the use of AI scribe software for clinical note transcription with the patient, who gave verbal consent to proceed.  History of Present Illness The patient, with a history of elevated cholesterol, presents for a routine check-up. She reports having seen her recent lab results and expresses concern about her slightly elevated cholesterol levels. The patient recalls having high cholesterol once before but states that it has usually been within the normal range. She is aware of the importance of diet and exercise in managing cholesterol levels and reports making efforts to reduce her intake of saturated fats and increase her physical activity. However, she admits that her exercise habits have decreased compared to the previous year. The patient also mentions taking  pantoprazole, requesting a refill for these medications. She reports noticing symptoms when she forgets to take this medication for a few days.  See problem oriented charting- ROS- full  review of systems was completed and negative.  The following were reviewed and entered/updated in epic: Past Medical History:  Diagnosis Date   Allergy 1985   Penicillin   Anemia 2024   Possible cause taking multivitamin with no iron   Arthritis 2008   Shoulder, maybe other areas   Blood transfusion without reported diagnosis 2024   Low hemoglobin   Chronic kidney disease 1997   Kidney Stone   GERD (gastroesophageal reflux disease) 2005   Father had Esophageal Cancer   Seizures (HCC) 1985   Only one seizure, after taking penicillin   Patient Active Problem List   Diagnosis Date Noted   Greater trochanteric pain syndrome of left lower extremity 09/01/2023   Obesity (BMI 30-39.9) 08/23/2023   Menopausal symptoms 08/23/2023   Bilateral hip pain  08/23/2023   Vaginal atrophy 08/23/2023   Dyspareunia in female 08/23/2023   Anemia, unspecified 01/10/2023   Past Surgical History:  Procedure Laterality Date   ABDOMINAL HYSTERECTOMY  2016   Precautionary due to Mother having Endometrial Cancer; I also had Ablation in 2007   ABLATION     2007    Family History  Problem Relation Age of Onset   Breast cancer Paternal Aunt    Cancer Mother    Vision loss Mother    Cancer Father    Diabetes Maternal Grandmother    Cancer Paternal Grandfather    Cancer Paternal Grandmother    Cancer Paternal Aunt    Medications- reviewed and updated Current Outpatient Medications  Medication Sig Dispense Refill   B Complex Vitamins (B COMPLEX PO) Take by mouth.     Berberine Chloride (BERBERINE HCI PO) Take by mouth.     Cholecalciferol 25 MCG (1000 UT) capsule 1 capsule every day by oral route.     Docusate Sodium (DSS) 100 MG CAPS      Evening Primrose Oil 1000 MG CAPS Take by mouth.     Ferrous Sulfate (IRON PO) Take by mouth.     ibuprofen (ADVIL) 200 MG tablet Take 200 mg by mouth every 6 (six) hours as needed.     Melatonin 10 MG CHEW Chew by mouth.     Multiple Vitamins-Minerals (MULTIVITAMIN WITH MINERALS) tablet Take 1 tablet by mouth daily.     Multiple Vitamins-Minerals (PRESERVISION AREDS 2 PO) Take by mouth.     Nutritional Supplements (ESTRO  SUPPORT ES PO) Take by mouth.     pantoprazole (PROTONIX) 40 MG tablet Take 1 tablet (40 mg total) by mouth daily. 90 tablet 1   No current facility-administered medications for this visit.   Allergies-reviewed and updated Allergies  Allergen Reactions   Penicillins Other (See Comments)    Seizure, at age 11 while taking penicillin   Social History   Social History Narrative   Not on file   Objective:  BP 126/79 (BP Location: Left Arm, Patient Position: Sitting, Cuff Size: Large)   Pulse 63   Temp 98 F (36.7 C) (Temporal)   Ht 4' 11.5" (1.511 m)   Wt 203 lb (92.1 kg)   SpO2  96%   BMI 40.31 kg/m  Physical Exam Vitals and nursing note reviewed.  Constitutional:      Appearance: Normal appearance.  HENT:     Head: Normocephalic.     Right Ear: Tympanic membrane normal.     Left Ear: Tympanic membrane normal.     Nose: Nose normal.     Mouth/Throat:     Mouth: Mucous membranes are moist.  Eyes:     Pupils: Pupils are equal, round, and reactive to light.  Cardiovascular:     Rate and Rhythm: Normal rate and regular rhythm.  Pulmonary:     Effort: Pulmonary effort is normal.     Breath sounds: Normal breath sounds.  Musculoskeletal:        General: Normal range of motion.     Cervical back: Normal range of motion.  Lymphadenopathy:     Cervical: No cervical adenopathy.  Skin:    General: Skin is warm and dry.  Neurological:     Mental Status: She is alert.  Psychiatric:        Mood and Affect: Mood normal.        Behavior: Behavior normal.      Assessment and Plan   Health Maintenance counseling: 1. Anticipatory guidance: Patient counseled regarding regular dental exams q6 months, eye exams,  avoiding smoking and second hand smoke, limiting alcohol to 1 beverage per day, no illicit drugs.   2. Risk factor reduction:  Advised patient of need for regular exercise and diet rich with fruits and vegetables to reduce risk of heart attack and stroke. Exercise- walking.  Wt Readings from Last 3 Encounters:  03/14/24 203 lb (92.1 kg)  11/21/23 199 lb (90.3 kg)  10/04/23 196 lb (88.9 kg)   3. Immunizations/screenings/ancillary studies Immunization History  Administered Date(s) Administered   Hep B, Unspecified 08/17/2023   Influenza, Quadrivalent, Recombinant, Inj, Pf 09/12/2018, 09/20/2019   Influenza,inj,Quad PF,6+ Mos 08/17/2023   Influenza-Unspecified 10/08/2021, 09/26/2022, 08/17/2023   Moderna Covid-19 Vaccine Bivalent Booster 64yrs & up 10/08/2021, 08/17/2023   Moderna Sars-Covid-2 Vaccination 10/31/2020, 04/05/2021   PFIZER Comirnaty(Gray  Top)Covid-19 Tri-Sucrose Vaccine 03/04/2020, 03/25/2020   Tdap 01/04/2011, 03/14/2024   Unspecified SARS-COV-2 Vaccination 09/26/2022, 08/17/2023   Zoster Recombinant(Shingrix) 11/17/2019, 01/18/2020   Zoster, Live 11/17/2019, 01/18/2020   There are no preventive care reminders to display for this patient.   4. Cervical cancer screening- Jan 2023  5. Breast cancer screening-  mammogram done 2024 6. Colon cancer screening -  due soon 7. Skin cancer screening- advised regular sunscreen use. Denies worrisome, changing, or new skin lesions.  8. Birth control/STD check-  postmenopausal  9. Osteoporosis screening- done - not due 10. Alcohol screening: rare, less than 2 per week 11. Smoking associated screening (lung cancer screening, AAA screen 65-75, UA)- non-smoker  Assessment and Plan Assessment & Plan Gastroesophageal Reflux Disease (GERD) - GERD managed with pantoprazole. Symptoms exacerbated by missed doses. Prefers pantoprazole due to cost and insurance coverage. - Refill pantoprazole 40mg  qd. -F/U in 6 mos  Hyperlipidemia - Mildly elevated cholesterol with LDL elevation- 126. Cardiovascular risk assessment at 2.6%, below medication threshold. Emphasized diet and exercise for cholesterol management. - Advise dietary modifications to reduce saturated fat intake. -Encouraged increased exercise and weight loss. - Monitor cholesterol level again in 1 year.  General Health Maintenance - Normal lab results outside of high LDL. Discussed exercise benefits and alternative ways to stay active. Emphasized regular screenings and vaccinations. - Administer Tdap vaccine. - Encourage regular exercise, such as walking or using a treadmill. - Advise on maintaining hydration, especially when consuming caffeine. - Ensure up-to-date on vaccinations, including COVID-19 booster. - Provide referrals for dermatology and gastroenterology for Colonoscopy.  Follow-up Discussed importance of contacting  office if referrals are not received. - Follow up on dermatology and gastroenterology referrals & contact office if referrals are not received after next week.   Recommended follow up:  Return for any future concerns, Complete physical w/fasting labs. No future appointments.   Dulce Sellar, NP

## 2024-03-15 ENCOUNTER — Encounter: Payer: Self-pay | Admitting: Family

## 2024-03-17 NOTE — Progress Notes (Signed)
Lab results reviewed during office visit.

## 2024-04-02 DIAGNOSIS — M71372 Other bursal cyst, left ankle and foot: Secondary | ICD-10-CM | POA: Diagnosis not present

## 2024-04-02 DIAGNOSIS — L814 Other melanin hyperpigmentation: Secondary | ICD-10-CM | POA: Diagnosis not present

## 2024-04-02 DIAGNOSIS — D225 Melanocytic nevi of trunk: Secondary | ICD-10-CM | POA: Diagnosis not present

## 2024-04-02 DIAGNOSIS — L821 Other seborrheic keratosis: Secondary | ICD-10-CM | POA: Diagnosis not present

## 2024-06-25 ENCOUNTER — Ambulatory Visit
Admission: EM | Admit: 2024-06-25 | Discharge: 2024-06-25 | Disposition: A | Discharge status: Home / Self Care | Source: Ambulatory Visit | Attending: Nurse Practitioner | Admitting: Nurse Practitioner

## 2024-06-25 ENCOUNTER — Encounter: Payer: Self-pay | Admitting: Emergency Medicine

## 2024-06-25 DIAGNOSIS — R21 Rash and other nonspecific skin eruption: Secondary | ICD-10-CM

## 2024-06-25 DIAGNOSIS — W57XXXA Bitten or stung by nonvenomous insect and other nonvenomous arthropods, initial encounter: Secondary | ICD-10-CM | POA: Diagnosis not present

## 2024-06-25 DIAGNOSIS — S90561A Insect bite (nonvenomous), right ankle, initial encounter: Secondary | ICD-10-CM | POA: Diagnosis not present

## 2024-06-25 MED ORDER — TRIAMCINOLONE ACETONIDE 0.1 % EX CREA
1.0000 | TOPICAL_CREAM | Freq: Two times a day (BID) | CUTANEOUS | 0 refills | Status: AC
Start: 2024-06-25 — End: ?

## 2024-06-25 NOTE — Discharge Instructions (Addendum)
 Apply medication as prescribed. You may take over-the-counter Tylenol  or ibuprofen as needed for pain, fever, or general discomfort. Continue an over-the-counter antihistamine to help with itching.  As discussed, you may take over-the-counter Zyrtec, Claritin, or Allegra during the daytime, and Benadryl  at bedtime. Apply cool compresses to the area to help with pain, swelling, or discomfort. Monitor symptoms for signs of worsening.  Seek care if you experience increased redness, swelling, or other concerns. Follow-up as needed.

## 2024-06-25 NOTE — ED Triage Notes (Signed)
 Pt reports insect bite to the lateral side of right ankle on Sunday morning, swelling and redness present.

## 2024-06-25 NOTE — ED Provider Notes (Signed)
 [facility]    CSN: [id number] Arrival date & time: 06/25/24  1149      History   Chief Complaint No chief complaint on file.   HPI Karen [name] is a 58 y.o. female.   The history is provided by the patient.   Patient presents for complaints of an insect bite to the right ankle.  Over the past day or so, patient has noticed increased redness and swelling at the site.  She is concern for infection.  She denies fever, chills, chest pain, abdominal pain, nausea, or vomiting.  States she has been taking Benadryl  for her symptoms, states she is not taking any Benadryl  today.  Past Medical History:  Diagnosis Date   Allergy 1985   Penicillin   Anemia 2024   Possible cause taking multivitamin with no iron    Arthritis 2008   Shoulder, maybe other areas   Blood transfusion without reported diagnosis 2024   Low hemoglobin   Chronic kidney disease 1997   Kidney Stone   GERD (gastroesophageal reflux disease) 2005   Father had Esophageal Cancer   Seizures (HCC) 1985   Only one seizure, after taking penicillin    Patient Active Problem List   Diagnosis Date Noted   Greater trochanteric pain syndrome of left lower extremity 09/01/2023   Obesity (BMI 30-39.9) 08/23/2023   Menopausal symptoms 08/23/2023   Bilateral hip pain 08/23/2023   Vaginal atrophy 08/23/2023   Dyspareunia in female 08/23/2023   Anemia, unspecified 01/10/2023    Past Surgical History:  Procedure Laterality Date   ABDOMINAL HYSTERECTOMY  2016   Precautionary due to Mother having Endometrial Cancer; I also had Ablation in 2007   ABLATION     2007    OB History   No obstetric history on file.      Home Medications    Prior to Admission medications   Medication Sig Start Date End Date Taking? Authorizing Provider  B Complex Vitamins (B COMPLEX PO) Take by mouth.    [provider]  Berberine Chloride (BERBERINE HCI PO) Take by mouth.    [provider]  Cholecalciferol  25 MCG (1000 UT) capsule 1 capsule every day by oral route.    [provider]  Docusate Sodium (DSS) 100 MG CAPS  11/02/20   [provider]  Evening Primrose Oil 1000 MG CAPS Take by mouth.    [provider]  Ferrous Sulfate (IRON  PO) Take by mouth.    [provider]  ibuprofen (ADVIL) 200 MG tablet Take 200 mg by mouth every 6 (six) hours as needed.    [provider]  Melatonin 10 MG CHEW Chew by mouth.    [provider]  Multiple Vitamins-Minerals (MULTIVITAMIN WITH MINERALS) tablet Take 1 tablet by mouth daily.    [provider]  Multiple Vitamins-Minerals (PRESERVISION AREDS 2 PO) Take by mouth.    [provider]  Nutritional Supplements (ESTRO SUPPORT ES PO) Take by mouth.    [provider]  pantoprazole  (PROTONIX ) 40 MG tablet Take 1 tablet (40 mg total) by mouth daily. 03/14/24   Lucius Krabbe, NP    Family History Family History  Problem Relation Age of Onset   Breast cancer Paternal Aunt    Cancer Mother    Vision loss Mother    Cancer Father    Diabetes Maternal Grandmother    Cancer Paternal Grandfather    Cancer Paternal Grandmother    Cancer Paternal Aunt  Social History Social History   Tobacco Use   Smoking status: Never    Passive exposure: Never   Smokeless tobacco: Never  Substance Use Topics   Alcohol use: Yes    Alcohol/week: 4.0 standard drinks of alcohol    Types: 4 Cans of beer per week   Drug use: Never     Allergies   Penicillins   Review of Systems Review of Systems Per HPI  Physical Exam Triage Vital Signs ED Triage Vitals  Encounter Vitals Group     BP 06/25/24 1213 (!) 143/79     Girls Systolic BP Percentile --      Girls Diastolic BP Percentile --      Boys Systolic BP Percentile --      Boys Diastolic BP Percentile --      Pulse Rate 06/25/24 1213 73     Resp 06/25/24 1213 18     Temp 06/25/24 1213 98.7 F (37.1 C)     Temp src --       SpO2 06/25/24 1213 95 %     Weight --      Height --      Head Circumference --      Peak Flow --      Pain Score 06/25/24 1219 0     Pain Loc --      Pain Education --      Exclude from Growth Chart --    No data found.  Updated Vital Signs BP (!) 143/79 (BP Location: Right Arm)   Pulse 73   Temp 98.7 F (37.1 C)   Resp 18   SpO2 95%   Visual Acuity Right Eye Distance:   Left Eye Distance:   Bilateral Distance:    Right Eye Near:   Left Eye Near:    Bilateral Near:     Physical Exam Vitals and nursing note reviewed.  Constitutional:      General: She is not in acute distress.    Appearance: Normal appearance.  HENT:     Head: Normocephalic.  Eyes:     Extraocular Movements: Extraocular movements intact.     Conjunctiva/sclera: Conjunctivae normal.     Pupils: Pupils are equal, round, and reactive to light.  Cardiovascular:     Rate and Rhythm: Normal rate and regular rhythm.     Pulses: Normal pulses.     Heart sounds: Normal heart sounds.  Pulmonary:     Effort: Pulmonary effort is normal. No respiratory distress.     Breath sounds: Normal breath sounds. No stridor. No wheezing, rhonchi or rales.  Abdominal:     General: Bowel sounds are normal.     Palpations: Abdomen is soft.  Musculoskeletal:     Cervical back: Normal range of motion.  Skin:    General: Skin is warm and dry.     Findings: Erythema present.      Neurological:     General: No focal deficit present.     Mental Status: She is alert and oriented to person, place, and time.  Psychiatric:        Mood and Affect: Mood normal.        Behavior: Behavior normal.      UC Treatments / Results  Labs (all labs ordered are listed, but only abnormal results are displayed) Labs Reviewed - No data to display  EKG   Radiology No results found.  Procedures Procedures (including critical care time)  Medications Ordered in UC Medications -  No data to display  Initial Impression /  Assessment and Plan / UC Course  I have reviewed the triage vital signs and the nursing notes.  Pertinent labs & imaging results that were available during my care of the patient were reviewed by me and considered in my medical decision making (see chart for details).  Patient with localized skin eruption to the right lateral ankle after an insect bite.  Will treat with triamcinolone  cream 0.1% for patient to apply topically to help with inflammation.  Supportive care recommendations were provided discussed with the patient to include antihistamine, cool compresses, and to monitor for signs of worsening.  Discussed indications with patient regarding follow-up.  Patient was in agreement with this plan of care and verbalizes understanding.  All questions were answered.  Patient stable for discharge.  Final Clinical Impressions(s) / UC Diagnoses   Final diagnoses:  None   Discharge Instructions   None    ED Prescriptions   None    PDMP not reviewed this encounter.   Gilmer Etta PARAS, NP 06/25/24 1610

## 2024-08-29 ENCOUNTER — Encounter: Payer: Self-pay | Admitting: Family

## 2024-08-29 ENCOUNTER — Other Ambulatory Visit: Payer: Self-pay | Admitting: Family

## 2024-08-29 DIAGNOSIS — E782 Mixed hyperlipidemia: Secondary | ICD-10-CM

## 2024-08-29 DIAGNOSIS — Z136 Encounter for screening for cardiovascular disorders: Secondary | ICD-10-CM

## 2024-09-17 ENCOUNTER — Other Ambulatory Visit: Payer: Self-pay

## 2024-09-17 NOTE — Addendum Note (Signed)
 Addended by: Jaxson Anglin on: 09/17/2024 04:58 PM   Modules accepted: Orders

## 2024-10-08 ENCOUNTER — Ambulatory Visit (HOSPITAL_BASED_OUTPATIENT_CLINIC_OR_DEPARTMENT_OTHER)
Admission: RE | Admit: 2024-10-08 | Discharge: 2024-10-08 | Disposition: A | Discharge status: Home / Self Care | Payer: Self-pay | Source: Ambulatory Visit | Attending: Family | Admitting: Family

## 2024-10-08 DIAGNOSIS — Z136 Encounter for screening for cardiovascular disorders: Secondary | ICD-10-CM | POA: Insufficient documentation

## 2024-10-10 ENCOUNTER — Ambulatory Visit: Payer: Self-pay | Admitting: Family

## 2024-10-14 MED ORDER — ROSUVASTATIN CALCIUM 5 MG PO TABS: 5.0000 mg | ORAL_TABLET | ORAL | 1 refills | Status: AC

## 2024-10-14 NOTE — Progress Notes (Signed)
 crestor sent to pharmacy, thx.

## 2024-10-14 NOTE — Addendum Note (Signed)
 Addended by: Sherronda Sweigert on: 10/14/2024 10:15 AM   Modules accepted: Orders

## 2024-11-13 ENCOUNTER — Other Ambulatory Visit: Payer: Self-pay | Admitting: Family

## 2024-11-13 ENCOUNTER — Encounter: Payer: Self-pay | Admitting: Family

## 2024-11-13 DIAGNOSIS — D489 Neoplasm of uncertain behavior, unspecified: Secondary | ICD-10-CM

## 2024-11-13 NOTE — Telephone Encounter (Signed)
 Please review MyChart message

## 2024-11-13 NOTE — Telephone Encounter (Signed)
 Noted

## 2025-07-02 ENCOUNTER — Ambulatory Visit: Admitting: Physician Assistant
